# Patient Record
Sex: Female | Born: 1960 | Race: White | Hispanic: No | Smoking: Current every day smoker
Health system: Southern US, Community
[De-identification: ages and names within clinical notes are randomized; demographics above are authoritative.]

## PROBLEM LIST (undated history)

## (undated) DIAGNOSIS — M1990 Unspecified osteoarthritis, unspecified site: Secondary | ICD-10-CM

## (undated) HISTORY — PX: TUBAL LIGATION: SHX77

---

## 2006-12-11 ENCOUNTER — Ambulatory Visit (HOSPITAL_COMMUNITY): Admission: RE | Admit: 2006-12-11 | Discharge: 2006-12-11 | Payer: Self-pay | Admitting: Family Medicine

## 2006-12-20 ENCOUNTER — Ambulatory Visit (HOSPITAL_COMMUNITY): Admission: RE | Admit: 2006-12-20 | Discharge: 2006-12-20 | Payer: Self-pay | Admitting: Family Medicine

## 2007-01-03 ENCOUNTER — Ambulatory Visit (HOSPITAL_COMMUNITY): Admission: RE | Admit: 2007-01-03 | Discharge: 2007-01-03 | Payer: Self-pay | Admitting: Family Medicine

## 2008-05-30 ENCOUNTER — Ambulatory Visit (HOSPITAL_COMMUNITY): Admission: RE | Admit: 2008-05-30 | Discharge: 2008-05-30 | Payer: Self-pay | Admitting: Family Medicine

## 2010-12-20 ENCOUNTER — Other Ambulatory Visit (HOSPITAL_COMMUNITY): Payer: Self-pay | Admitting: Family Medicine

## 2010-12-20 DIAGNOSIS — Z139 Encounter for screening, unspecified: Secondary | ICD-10-CM

## 2010-12-28 ENCOUNTER — Ambulatory Visit (HOSPITAL_COMMUNITY): Payer: Self-pay

## 2011-01-04 ENCOUNTER — Ambulatory Visit (HOSPITAL_COMMUNITY)
Admission: RE | Admit: 2011-01-04 | Discharge: 2011-01-04 | Disposition: A | Discharge status: Home / Self Care | Payer: BC Managed Care – PPO | Source: Ambulatory Visit | Attending: Family Medicine | Admitting: Family Medicine

## 2011-01-04 DIAGNOSIS — Z1231 Encounter for screening mammogram for malignant neoplasm of breast: Secondary | ICD-10-CM | POA: Insufficient documentation

## 2011-01-04 DIAGNOSIS — Z139 Encounter for screening, unspecified: Secondary | ICD-10-CM

## 2012-02-15 ENCOUNTER — Other Ambulatory Visit (HOSPITAL_COMMUNITY): Payer: Self-pay | Admitting: Family Medicine

## 2012-02-15 DIAGNOSIS — Z139 Encounter for screening, unspecified: Secondary | ICD-10-CM

## 2012-02-20 ENCOUNTER — Ambulatory Visit (HOSPITAL_COMMUNITY): Payer: BC Managed Care – PPO

## 2012-02-23 ENCOUNTER — Ambulatory Visit (HOSPITAL_COMMUNITY): Payer: BC Managed Care – PPO

## 2012-03-01 ENCOUNTER — Ambulatory Visit (HOSPITAL_COMMUNITY): Payer: BC Managed Care – PPO

## 2012-03-13 ENCOUNTER — Ambulatory Visit (HOSPITAL_COMMUNITY): Payer: BC Managed Care – PPO

## 2012-03-15 ENCOUNTER — Ambulatory Visit (HOSPITAL_COMMUNITY): Payer: BC Managed Care – PPO

## 2012-08-07 ENCOUNTER — Ambulatory Visit (HOSPITAL_COMMUNITY)
Admission: RE | Admit: 2012-08-07 | Discharge: 2012-08-07 | Disposition: A | Discharge status: Home / Self Care | Payer: BC Managed Care – PPO | Source: Ambulatory Visit | Attending: Family Medicine | Admitting: Family Medicine

## 2012-08-07 DIAGNOSIS — Z1231 Encounter for screening mammogram for malignant neoplasm of breast: Secondary | ICD-10-CM | POA: Insufficient documentation

## 2012-08-07 DIAGNOSIS — Z139 Encounter for screening, unspecified: Secondary | ICD-10-CM

## 2013-07-02 ENCOUNTER — Telehealth: Payer: Self-pay

## 2013-07-02 NOTE — Telephone Encounter (Signed)
I called pt and she said she wants to check with her insurance first and call us back.

## 2013-07-31 NOTE — Telephone Encounter (Signed)
Letter mailed to pt.  

## 2013-10-10 ENCOUNTER — Encounter (HOSPITAL_COMMUNITY): Payer: Self-pay | Admitting: Emergency Medicine

## 2013-10-10 ENCOUNTER — Emergency Department (HOSPITAL_COMMUNITY)
Admission: EM | Admit: 2013-10-10 | Discharge: 2013-10-10 | Disposition: A | Discharge status: Home / Self Care | Payer: BC Managed Care – PPO | Attending: Emergency Medicine | Admitting: Emergency Medicine

## 2013-10-10 DIAGNOSIS — K029 Dental caries, unspecified: Secondary | ICD-10-CM | POA: Insufficient documentation

## 2013-10-10 DIAGNOSIS — F17200 Nicotine dependence, unspecified, uncomplicated: Secondary | ICD-10-CM | POA: Insufficient documentation

## 2013-10-10 DIAGNOSIS — M129 Arthropathy, unspecified: Secondary | ICD-10-CM | POA: Insufficient documentation

## 2013-10-10 DIAGNOSIS — Z791 Long term (current) use of non-steroidal anti-inflammatories (NSAID): Secondary | ICD-10-CM | POA: Insufficient documentation

## 2013-10-10 DIAGNOSIS — Z79899 Other long term (current) drug therapy: Secondary | ICD-10-CM | POA: Insufficient documentation

## 2013-10-10 DIAGNOSIS — K089 Disorder of teeth and supporting structures, unspecified: Secondary | ICD-10-CM | POA: Insufficient documentation

## 2013-10-10 HISTORY — DX: Unspecified osteoarthritis, unspecified site: M19.90

## 2013-10-10 MED ORDER — HYDROCODONE-ACETAMINOPHEN 5-325 MG PO TABS: 1.0000 | ORAL_TABLET | ORAL | Status: DC | PRN

## 2013-10-10 MED ORDER — PENICILLIN V POTASSIUM 500 MG PO TABS: 500.0000 mg | ORAL_TABLET | Freq: Three times a day (TID) | ORAL | Status: DC

## 2013-10-10 NOTE — ED Provider Notes (Signed)
Medical screening examination/treatment/procedure(s) were performed by non-physician practitioner and as supervising physician I was immediately available for consultation/collaboration.   EKG Interpretation None       Karlei Waldo, MD 10/10/13 2342 

## 2013-10-10 NOTE — Discharge Instructions (Signed)
Dental Care and Dentist Visits °Dental care supports good overall health. Regular dental visits can also help you avoid dental pain, bleeding, infection, and other more serious health problems in the future. It is important to keep the mouth healthy because diseases in the teeth, gums, and other oral tissues can spread to other areas of the body. Some problems, such as diabetes, heart disease, and pre-term labor have been associated with poor oral health.  °See your dentist every 6 months. If you experience emergency problems such as a toothache or broken tooth, go to the dentist right away. If you see your dentist regularly, you may catch problems early. It is easier to be treated for problems in the early stages.  °WHAT TO EXPECT AT A DENTIST VISIT  °Your dentist will look for many common oral health problems and recommend proper treatment. At your regular dental visit, you can expect: °· Gentle cleaning of the teeth and gums. This includes scraping and polishing. This helps to remove the sticky substance around the teeth and gums (plaque). Plaque forms in the mouth shortly after eating. Over time, plaque hardens on the teeth as tartar. If tartar is not removed regularly, it can cause problems. Cleaning also helps remove stains. °· Periodic X-rays. These pictures of the teeth and supporting bone will help your dentist assess the health of your teeth. °· Periodic fluoride treatments. Fluoride is a natural mineral shown to help strengthen teeth. Fluoride treatment involves applying a fluoride gel or varnish to the teeth. It is most commonly done in children. °· Examination of the mouth, tongue, jaws, teeth, and gums to look for any oral health problems, such as: °¨ Cavities (dental caries). This is decay on the tooth caused by plaque, sugar, and acid in the mouth. It is best to catch a cavity when it is small. °¨ Inflammation of the gums caused by plaque buildup (gingivitis). °¨ Problems with the mouth or malformed  or misaligned teeth. °¨ Oral cancer or other diseases of the soft tissues or jaws.  °KEEP YOUR TEETH AND GUMS HEALTHY °For healthy teeth and gums, follow these general guidelines as well as your dentist's specific advice: °· Have your teeth professionally cleaned at the dentist every 6 months. °· Brush twice daily with a fluoride toothpaste. °· Floss your teeth daily.  °· Ask your dentist if you need fluoride supplements, treatments, or fluoride toothpaste. °· Eat a healthy diet. Reduce foods and drinks with added sugar. °· Avoid smoking. °TREATMENT FOR ORAL HEALTH PROBLEMS °If you have oral health problems, treatment varies depending on the conditions present in your teeth and gums. °· Your caregiver will most likely recommend good oral hygiene at each visit. °· For cavities, gingivitis, or other oral health disease, your caregiver will perform a procedure to treat the problem. This is typically done at a separate appointment. Sometimes your caregiver will refer you to another dental specialist for specific tooth problems or for surgery. °SEEK IMMEDIATE DENTAL CARE IF: °· You have pain, bleeding, or soreness in the gum, tooth, jaw, or mouth area. °· A permanent tooth becomes loose or separated from the gum socket. °· You experience a blow or injury to the mouth or jaw area. °Document Released: 11/10/2010 Document Revised: 05/23/2011 Document Reviewed: 11/10/2010 °ExitCare® Patient Information ©2015 [employer]. This information is not intended to replace advice given to you by your health care provider. Make sure you discuss any questions you have with your health care provider. ° °Dental Caries °Dental caries is tooth decay. This   decay can cause a hole in teeth (cavity) that can get bigger and deeper over time. °HOME CARE °· Brush and floss your teeth. Do this at least two times a day. °· Use a fluoride toothpaste. °· Use a mouth rinse if told by your dentist or doctor. °· Eat less sugary and starchy foods.  Drink less sugary drinks. °· Avoid snacking often on sugary and starchy foods. Avoid sipping often on sugary drinks. °· Keep regular checkups and cleanings with your dentist. °· Use fluoride supplements if told by your dentist or doctor. °· Allow fluoride to be applied to teeth if told by your dentist or doctor. °Document Released: 12/08/2007 Document Revised: 07/15/2013 Document Reviewed: 03/02/2012 °ExitCare® Patient Information ©2015 [employer]. This information is not intended to replace advice given to you by your health care provider. Make sure you discuss any questions you have with your health care provider. ° °Emergency Department Resource Guide °1) Find a Doctor and Pay Out of Pocket °Although you won't have to find out who is covered by your insurance plan, it is a good idea to ask around and get recommendations. You will then need to call the office and see if the doctor you have chosen will accept you as a new patient and what types of options they offer for patients who are self-pay. Some doctors offer discounts or will set up payment plans for their patients who do not have insurance, but you will need to ask so you aren't surprised when you get to your appointment. ° °2) Contact Your Local Health Department °Not all health departments have doctors that can see patients for sick visits, but many do, so it is worth a call to see if yours does. If you don't know where your local health department is, you can check in your phone book. The CDC also has a tool to help you locate your state's health department, and many state websites also have listings of all of their local health departments. ° °3) Find a Walk-in Clinic °If your illness is not likely to be very severe or complicated, you may want to try a walk in clinic. These are popping up all over the country in pharmacies, drugstores, and shopping centers. They're usually staffed by nurse practitioners or physician assistants that have been trained  to treat common illnesses and complaints. They're usually fairly quick and inexpensive. However, if you have serious medical issues or chronic medical problems, these are probably not your best option. ° °No Primary Care Doctor: °- Call [facility] at  832-8000 - they can help you locate a primary care doctor that  accepts your insurance, provides certain services, etc. °- Physician Referral Service- 1-800-533-3463 ° ° ° °Dental Care: °Organization         Address  Phone  Notes  °[facility] [address], [place] [phone] Accepts children up to age 21 who are enrolled in Medicaid or [place] Health Choice; pregnant women with a Medicaid card; and children who have applied for Medicaid or [place] Health Choice, but were declined, whose parents can pay a reduced fee at time of service.  °[facility] [place]  [address], [place] [phone] Accepts children up to age 21 who are enrolled in Medicaid or [place] Health Choice; pregnant women with a Medicaid card; and children who have applied for Medicaid or [place] Health Choice, but were declined, whose parents   can pay a reduced fee at time of service.  °[facility] ° [address], [place] [phone] Patients are seen by appointment only. Walk-ins are not accepted. [facility] will see patients 18 years of age and older. °Monday - Tuesday (8am-5pm) °Most Wednesdays (8:30-5pm) °$30 per visit, cash only  °[facility] ° [address], [place] [phone] Patients are seen by appointment only. Walk-ins are not accepted. [facility] will see patients 18 years of age and older. °One Wednesday Evening (Monthly: Volunteer Based).  $30 per visit, cash only  °[facility]  [phone] for adults; Children under age 4, call Graduate Pediatric Dentistry at [phone].  Children aged 4-14, please call [phone] to request a pediatric application. ° Dental services are provided in all areas of dental care including fillings, crowns and bridges, complete and partial dentures, implants, gum treatment, root canals, and extractions. Preventive care is also provided. Treatment is provided to both adults and children. °Patients are selected via a lottery and there is often a waiting list. °  °[facility] [address], °[place] ° [phone] [email or web address].[email or web address] °  °[facility] [address], [place], [place] [phone], Ext. [phone] Second and Fourth Thursday of each month, opens at 6:30 AM; Clinic ends at 9 AM.  Patients are seen on a first-come first-served basis, and a limited number are seen during each clinic.  ° °[facility] ° [address], [place], [place] [phone]   Eligibility Requirements °You must have lived in [place], [place], or [place] counties for at least the last three months. °  You cannot be eligible for state or federal sponsored healthcare insurance, including Veterans Administration, Medicaid, or Medicare. °  You generally cannot be eligible for healthcare insurance through your employer.  °  How to apply: °Eligibility screenings are held every Tuesday and Wednesday afternoon from 1:00 pm until 4:00 pm. You do not need an appointment for the interview!  °[facility] [address], [place], [place] [phone]   °[facility]  [phone]   °[facility]  [phone]   ° [facility]  [phone]   ° °

## 2013-10-10 NOTE — ED Notes (Signed)
Pain rt mandibular molar for  2 days

## 2013-10-10 NOTE — ED Provider Notes (Signed)
CSN: [id number]     Arrival date & time 10/10/13  1540 History   First MD Initiated Contact with Patient 10/10/13 1600     Chief Complaint  Patient presents with  . Dental Pain     (Consider location/radiation/quality/duration/timing/severity/associated sxs/prior Treatment) Patient is a 53 y.o. female presenting with tooth pain. The history is provided by the patient. No language interpreter was used.  Dental Pain Location:  Lower Lower teeth location:  32/RL 3rd molar Severity:  Moderate Duration:  2 days Timing:  Constant Associated symptoms: no facial swelling and no fever   Associated symptoms comment:  New pain at eroding tooth for the past 2 days. No fever or facial swelling.   Past Medical History  Diagnosis Date  . Arthritis    Past Surgical History  Procedure Laterality Date  . Tubal ligation     History reviewed. No pertinent family history. History  Substance Use Topics  . Smoking status: Current Every Day Smoker  . Smokeless tobacco: Not on file  . Alcohol Use: No   OB History   Grav Para Term Preterm Abortions TAB SAB Ect Mult Living                 Review of Systems  Constitutional: Negative for fever.  HENT: Positive for dental problem. Negative for facial swelling and trouble swallowing.   Gastrointestinal: Negative for nausea.      Allergies  Augmentin  Home Medications   Prior to Admission medications   Medication Sig Start Date End Date Taking? Authorizing Provider  acetaminophen (TYLENOL) 500 MG tablet Take 1,000 mg by mouth every 6 (six) hours as needed for mild pain or moderate pain.   Yes Historical Provider, MD  B-Complex TABS Take 1 tablet by mouth daily.   Yes Historical Provider, MD  cholecalciferol (VITAMIN D) 1000 UNITS tablet Take 1,000 Units by mouth at bedtime.   Yes Historical Provider, MD  meloxicam (MOBIC) 15 MG tablet Take 15 mg by mouth every evening.   Yes Historical Provider, MD   BP 133/89  Pulse 69  Temp(Src) 98.4  F (36.9 C) (Oral)  Resp 18  SpO2 97% Physical Exam  Constitutional: She appears well-developed and well-nourished. No distress.  HENT:  Significant erosion to #32. Partially edentulous. No facial swelling. No submental adenopathy.  Pulmonary/Chest: Effort normal.  Lymphadenopathy:    She has no cervical adenopathy.    ED Course  Procedures (including critical care time) Labs Review Labs Reviewed - No data to display  Imaging Review No results found.   EKG Interpretation None      MDM   Final diagnoses:  None    1. Dental caries  Severe decay of right lower wisdom tooth. Will cover with abx, pain medications, dental referral.    Arnoldo HookerShari A Khushi Zupko, PA-C 10/10/13 1652

## 2013-10-10 NOTE — ED Notes (Signed)
Pt alert & oriented x4, stable gait. Patient given discharge instructions, paperwork & prescription(s). Patient  instructed to stop at the registration desk to finish any additional paperwork. Patient verbalized understanding. Pt left department w/ no further questions. 

## 2013-10-21 ENCOUNTER — Other Ambulatory Visit (HOSPITAL_COMMUNITY): Payer: Self-pay | Admitting: Family Medicine

## 2013-10-21 DIAGNOSIS — Z139 Encounter for screening, unspecified: Secondary | ICD-10-CM

## 2013-10-30 ENCOUNTER — Ambulatory Visit (HOSPITAL_COMMUNITY)
Admission: RE | Admit: 2013-10-30 | Discharge: 2013-10-30 | Disposition: A | Discharge status: Home / Self Care | Payer: BC Managed Care – PPO | Source: Ambulatory Visit | Attending: Family Medicine | Admitting: Family Medicine

## 2013-10-30 DIAGNOSIS — R928 Other abnormal and inconclusive findings on diagnostic imaging of breast: Secondary | ICD-10-CM | POA: Diagnosis not present

## 2013-10-30 DIAGNOSIS — Z1231 Encounter for screening mammogram for malignant neoplasm of breast: Secondary | ICD-10-CM | POA: Insufficient documentation

## 2013-10-30 DIAGNOSIS — Z139 Encounter for screening, unspecified: Secondary | ICD-10-CM

## 2013-11-01 ENCOUNTER — Other Ambulatory Visit: Payer: Self-pay | Admitting: Family Medicine

## 2013-11-01 DIAGNOSIS — R928 Other abnormal and inconclusive findings on diagnostic imaging of breast: Secondary | ICD-10-CM

## 2013-11-04 ENCOUNTER — Other Ambulatory Visit: Payer: Self-pay | Admitting: Family Medicine

## 2013-11-04 DIAGNOSIS — R928 Other abnormal and inconclusive findings on diagnostic imaging of breast: Secondary | ICD-10-CM

## 2013-11-07 ENCOUNTER — Other Ambulatory Visit: Payer: BC Managed Care – PPO

## 2013-11-13 ENCOUNTER — Ambulatory Visit
Admission: RE | Admit: 2013-11-13 | Discharge: 2013-11-13 | Disposition: A | Discharge status: Home / Self Care | Payer: BC Managed Care – PPO | Source: Ambulatory Visit | Attending: Family Medicine | Admitting: Family Medicine

## 2013-11-13 ENCOUNTER — Encounter (INDEPENDENT_AMBULATORY_CARE_PROVIDER_SITE_OTHER): Payer: Self-pay

## 2013-11-13 DIAGNOSIS — R928 Other abnormal and inconclusive findings on diagnostic imaging of breast: Secondary | ICD-10-CM

## 2014-04-02 ENCOUNTER — Telehealth: Payer: Self-pay

## 2014-04-11 NOTE — Telephone Encounter (Signed)
Pt wants to wait til April 2016 for colonoscopy. Letter sent to PCP.

## 2014-04-24 IMAGING — MG MM DIGITAL SCREENING
4 series · 4 of 4 positions shown · non-contrast
Comparison: Previous exams.

CLINICAL DATA: Screening.

DIGITAL BILATERAL SCREENING MAMMOGRAM WITH CAD

[L CC]
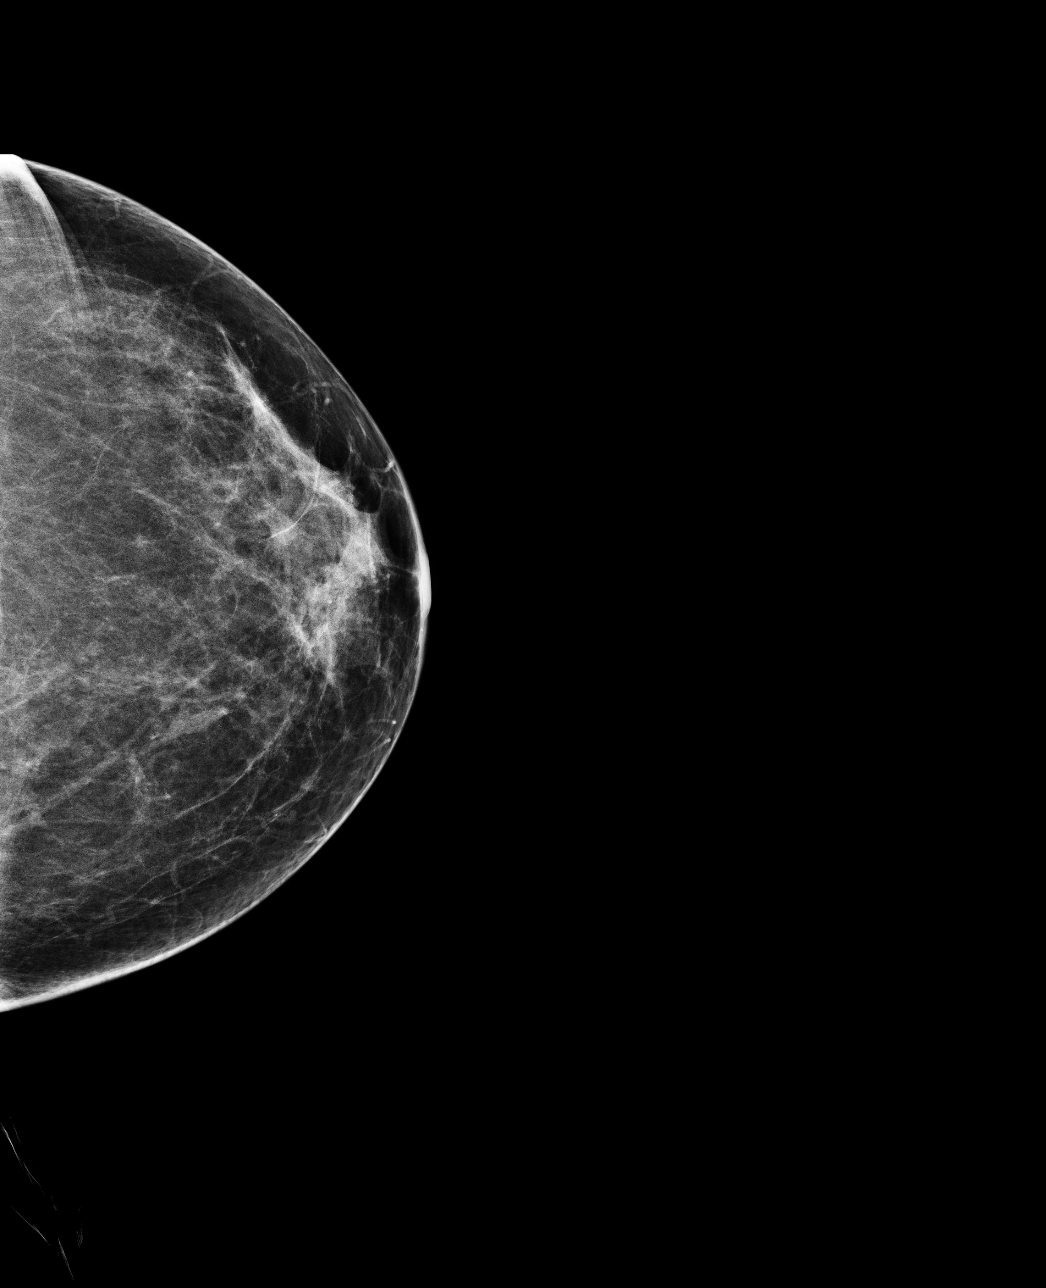

[L MLO]
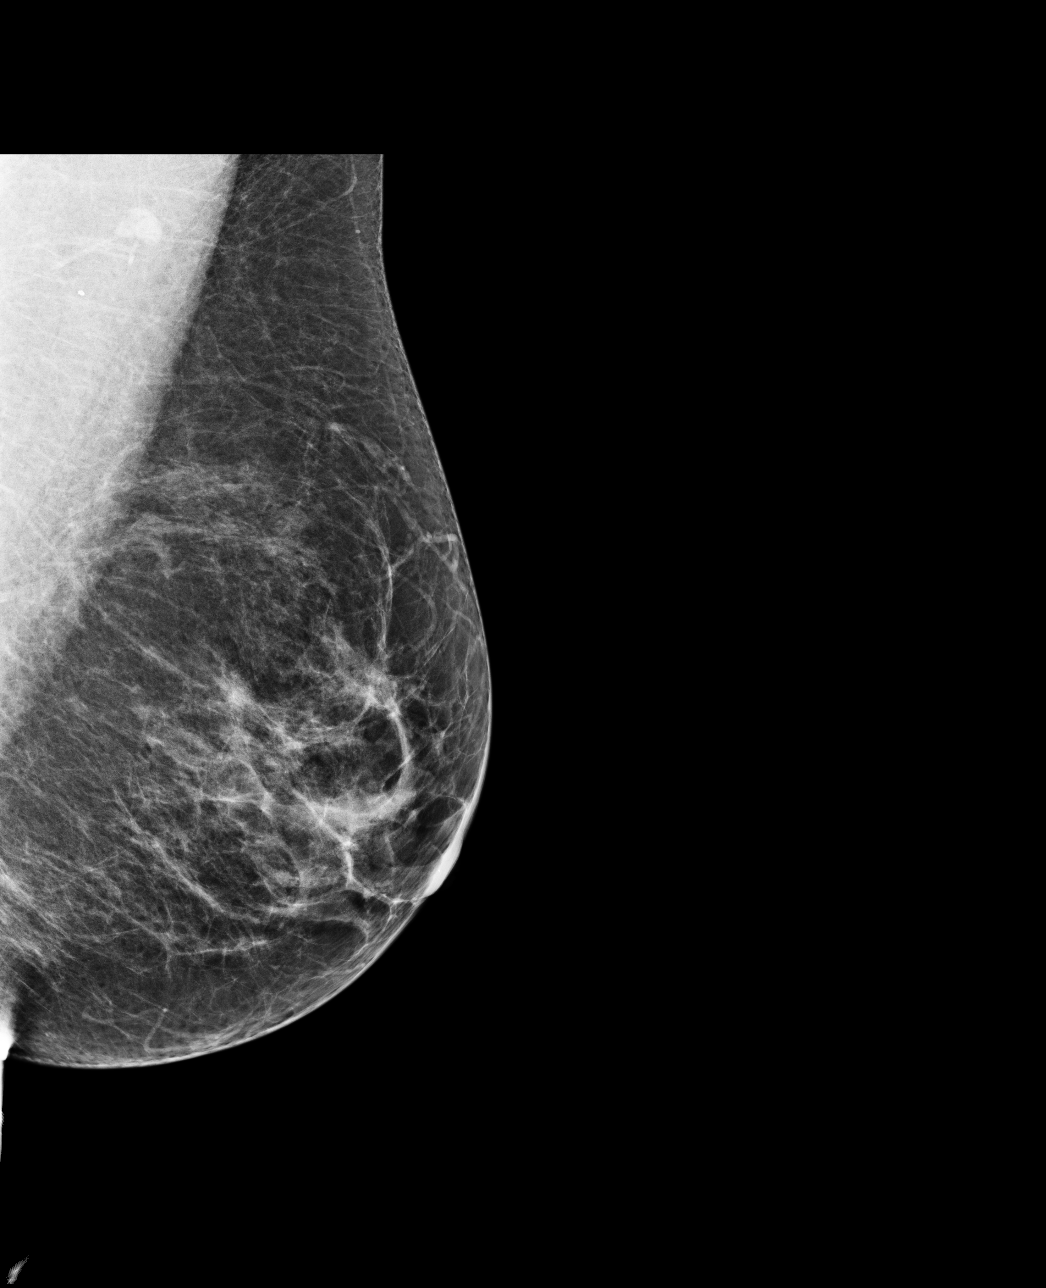

[R CC]
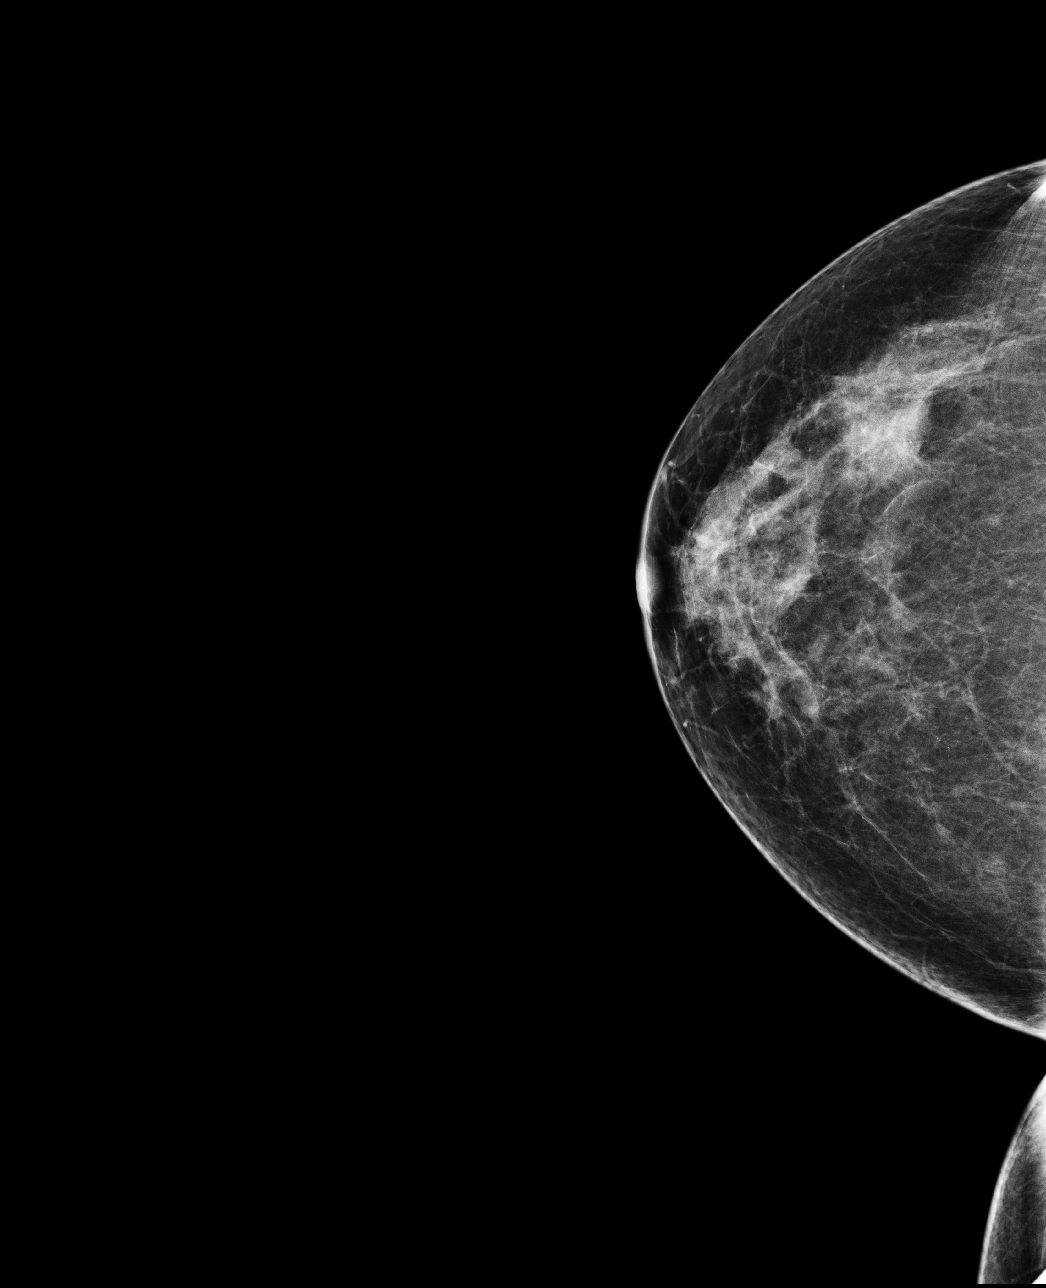

[R MLO]
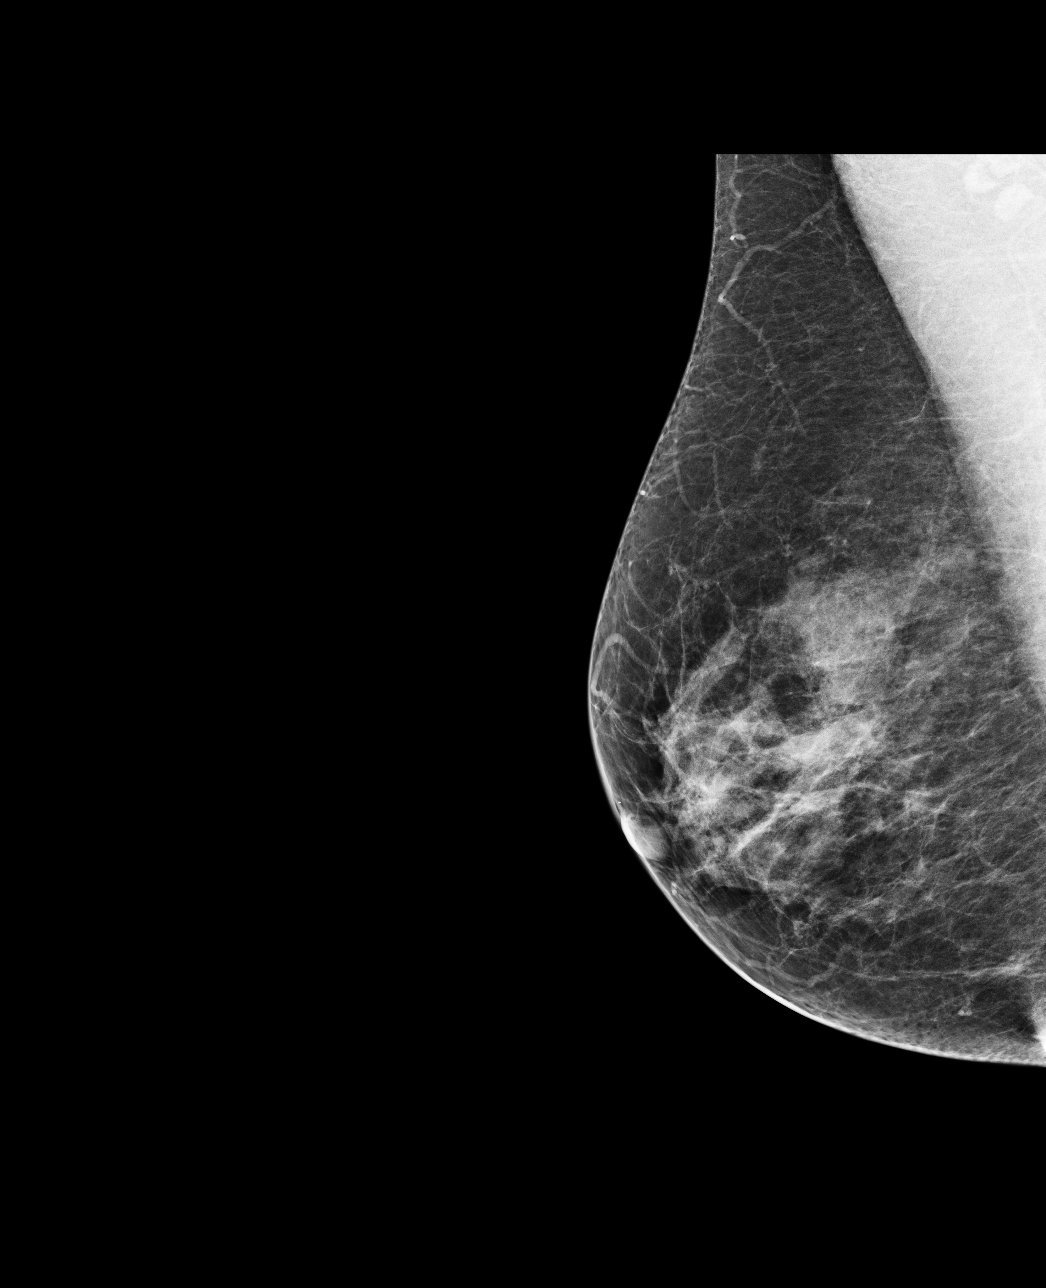

[4 of 4 positions shown; findings below may reference images not displayed]

FINDINGS: ACR Breast Density Category 2: There is a scattered fibroglandular
pattern.

There are no findings suspicious for malignancy.

Images were processed with CAD.
IMPRESSION: No mammographic evidence of malignancy.

A result letter of this screening mammogram will be mailed directly
to the patient.

RECOMMENDATION:
Screening mammogram in one year. (Code:[id number])

BI-RADS CATEGORY 1:  Negative.

## 2015-02-27 ENCOUNTER — Other Ambulatory Visit (HOSPITAL_COMMUNITY): Payer: Self-pay | Admitting: Physician Assistant

## 2015-02-27 DIAGNOSIS — Z1231 Encounter for screening mammogram for malignant neoplasm of breast: Secondary | ICD-10-CM

## 2015-03-11 ENCOUNTER — Ambulatory Visit (HOSPITAL_COMMUNITY)
Admission: RE | Admit: 2015-03-11 | Discharge: 2015-03-11 | Disposition: A | Discharge status: Home / Self Care | Payer: BLUE CROSS/BLUE SHIELD | Source: Ambulatory Visit | Attending: Physician Assistant | Admitting: Physician Assistant

## 2015-03-11 DIAGNOSIS — Z1231 Encounter for screening mammogram for malignant neoplasm of breast: Secondary | ICD-10-CM | POA: Diagnosis present

## 2015-05-15 ENCOUNTER — Other Ambulatory Visit: Payer: Self-pay | Admitting: *Deleted

## 2016-04-29 ENCOUNTER — Other Ambulatory Visit (HOSPITAL_COMMUNITY): Payer: Self-pay | Admitting: Internal Medicine

## 2016-04-29 DIAGNOSIS — Z1231 Encounter for screening mammogram for malignant neoplasm of breast: Secondary | ICD-10-CM

## 2016-05-25 ENCOUNTER — Ambulatory Visit (HOSPITAL_COMMUNITY): Payer: BLUE CROSS/BLUE SHIELD

## 2016-06-15 ENCOUNTER — Ambulatory Visit (HOSPITAL_COMMUNITY)
Admission: RE | Admit: 2016-06-15 | Discharge: 2016-06-15 | Disposition: A | Discharge status: Home / Self Care | Payer: BLUE CROSS/BLUE SHIELD | Source: Ambulatory Visit | Attending: Internal Medicine | Admitting: Internal Medicine

## 2016-06-15 DIAGNOSIS — Z1231 Encounter for screening mammogram for malignant neoplasm of breast: Secondary | ICD-10-CM | POA: Diagnosis present

## 2016-08-24 ENCOUNTER — Ambulatory Visit (HOSPITAL_COMMUNITY)
Admission: RE | Admit: 2016-08-24 | Discharge: 2016-08-24 | Disposition: A | Discharge status: Home / Self Care | Payer: BLUE CROSS/BLUE SHIELD | Source: Ambulatory Visit | Attending: Family Medicine | Admitting: Family Medicine

## 2016-08-24 ENCOUNTER — Other Ambulatory Visit (HOSPITAL_COMMUNITY): Payer: Self-pay | Admitting: Family Medicine

## 2016-08-24 DIAGNOSIS — M79671 Pain in right foot: Secondary | ICD-10-CM | POA: Insufficient documentation

## 2016-08-24 DIAGNOSIS — M25572 Pain in left ankle and joints of left foot: Secondary | ICD-10-CM | POA: Diagnosis not present

## 2016-08-24 DIAGNOSIS — R52 Pain, unspecified: Secondary | ICD-10-CM

## 2016-08-24 DIAGNOSIS — M19072 Primary osteoarthritis, left ankle and foot: Secondary | ICD-10-CM | POA: Diagnosis not present

## 2016-08-24 DIAGNOSIS — M25562 Pain in left knee: Secondary | ICD-10-CM | POA: Diagnosis present

## 2016-08-24 DIAGNOSIS — M25561 Pain in right knee: Secondary | ICD-10-CM | POA: Insufficient documentation

## 2016-08-24 DIAGNOSIS — M25571 Pain in right ankle and joints of right foot: Secondary | ICD-10-CM | POA: Insufficient documentation

## 2016-08-24 DIAGNOSIS — G8929 Other chronic pain: Secondary | ICD-10-CM | POA: Insufficient documentation

## 2016-08-24 DIAGNOSIS — M79672 Pain in left foot: Secondary | ICD-10-CM | POA: Diagnosis present

## 2016-08-24 DIAGNOSIS — M1711 Unilateral primary osteoarthritis, right knee: Secondary | ICD-10-CM | POA: Insufficient documentation

## 2016-11-30 ENCOUNTER — Telehealth: Payer: Self-pay

## 2016-11-30 NOTE — Telephone Encounter (Signed)
Pt received a triage letter from DS and she wants to schedule her colonoscopy for sometime in October. I told her the triage nurse does these in the afternoon from 130-430pm. She said that she works at [employer] and can't get a good signal if we call her while she's at work. She said to [facility] and she would call back. [phone]

## 2016-12-06 NOTE — Telephone Encounter (Signed)
Pt is busy now and will call back this afternoon to be triaged.

## 2016-12-08 ENCOUNTER — Telehealth: Payer: Self-pay

## 2016-12-08 NOTE — Telephone Encounter (Signed)
See separate triage.  

## 2016-12-13 NOTE — Telephone Encounter (Signed)
Needs office visit. On Ativan and chronic narcotics it appears?

## 2016-12-13 NOTE — Telephone Encounter (Signed)
Gastroenterology Pre-Procedure Review  Request Date: 12/08/2016 Requesting Physician: Debbe Mounts, MD  PATIENT REVIEW QUESTIONS: The patient responded to the following health history questions as indicated:    1. Diabetes Melitis: no 2. Joint replacements in the past 12 months: no 3. Major health problems in the past 3 months: no 4. Has an artificial valve or MVP: no 5. Has a defibrillator: no 6. Has been advised in past to take antibiotics in advance of a procedure like teeth cleaning: no 7. Family history of colon cancer: no  8. Alcohol Use: no 9. History of sleep apnea: no  10. History of coronary artery or other vascular stents placed within the last 12 months: no 11. History of any prior anesthesia complications: no    MEDICATIONS & ALLERGIES:    Patient reports the following regarding taking any blood thinners:   Plavix? no Aspirin? no Coumadin? no Brilinta? no Xarelto? no Eliquis? no Pradaxa? no Savaysa? no Effient? no  Patient confirms/reports the following medications:  Current Outpatient Prescriptions  Medication Sig Dispense Refill  . acetaminophen (TYLENOL) 500 MG tablet Take 1,000 mg by mouth every 6 (six) hours as needed for mild pain or moderate pain.    Marland Kitchen B-Complex TABS Take 1 tablet by mouth daily.    . cholecalciferol (VITAMIN D) 1000 UNITS tablet Take 5,000 Units by mouth at bedtime.     Marland Kitchen LORazepam (ATIVAN) 0.5 MG tablet Take 0.5 mg by mouth 2 (two) times daily.    . meloxicam (MOBIC) 15 MG tablet Take 15 mg by mouth every evening.    Marland Kitchen oxyCODONE-acetaminophen (PERCOCET) 10-325 MG tablet Take 1 tablet by mouth every 4 (four) hours as needed for pain. Takes one tablet 4 times a day  ( arthritis)     No current facility-administered medications for this visit.     Patient confirms/reports the following allergies:  Allergies  Allergen Reactions  . Augmentin [Amoxicillin-Pot Clavulanate] Itching and Rash    No orders of the defined types were  placed in this encounter.   AUTHORIZATION INFORMATION Primary Insurance:   ID #:  Group #:  Pre-Cert / Auth required:  Pre-Cert / Auth #:   Secondary Insurance:   ID #:  Group #:  Pre-Cert / Auth required:  Pre-Cert / Auth #:   SCHEDULE INFORMATION: Procedure has been scheduled as follows:  Date:              Time:   Location:   This Gastroenterology Pre-Precedure Review Form is being routed to the following provider(s): Jonette Eva, MD

## 2016-12-14 NOTE — Telephone Encounter (Signed)
Pt has been scheduled OV with Wynne Dust, NP on 02/01/2017 at 10:30.

## 2017-02-01 ENCOUNTER — Ambulatory Visit: Payer: BLUE CROSS/BLUE SHIELD | Admitting: Nurse Practitioner

## 2017-03-28 ENCOUNTER — Ambulatory Visit: Payer: BLUE CROSS/BLUE SHIELD | Admitting: Nurse Practitioner

## 2017-10-25 ENCOUNTER — Other Ambulatory Visit (HOSPITAL_COMMUNITY): Payer: Self-pay | Admitting: Family Medicine

## 2017-10-25 DIAGNOSIS — Z1231 Encounter for screening mammogram for malignant neoplasm of breast: Secondary | ICD-10-CM

## 2017-11-01 ENCOUNTER — Ambulatory Visit (HOSPITAL_COMMUNITY): Payer: BLUE CROSS/BLUE SHIELD

## 2017-12-06 ENCOUNTER — Other Ambulatory Visit: Payer: Self-pay | Admitting: Adult Health

## 2018-01-12 ENCOUNTER — Other Ambulatory Visit: Payer: Self-pay | Admitting: Adult Health

## 2018-03-01 ENCOUNTER — Other Ambulatory Visit: Payer: Self-pay | Admitting: Adult Health

## 2020-04-22 ENCOUNTER — Ambulatory Visit (HOSPITAL_COMMUNITY)
Admission: RE | Admit: 2020-04-22 | Discharge: 2020-04-22 | Disposition: A | Discharge status: Home / Self Care | Payer: BC Managed Care – PPO | Source: Ambulatory Visit | Attending: Family Medicine | Admitting: Family Medicine

## 2020-04-22 ENCOUNTER — Other Ambulatory Visit (HOSPITAL_COMMUNITY): Payer: Self-pay | Admitting: Family Medicine

## 2020-04-22 ENCOUNTER — Other Ambulatory Visit: Payer: Self-pay

## 2020-04-22 DIAGNOSIS — W19XXXA Unspecified fall, initial encounter: Secondary | ICD-10-CM | POA: Insufficient documentation

## 2020-04-22 DIAGNOSIS — Y929 Unspecified place or not applicable: Secondary | ICD-10-CM | POA: Insufficient documentation

## 2020-04-22 DIAGNOSIS — Y939 Activity, unspecified: Secondary | ICD-10-CM | POA: Diagnosis not present

## 2020-04-22 DIAGNOSIS — Z1231 Encounter for screening mammogram for malignant neoplasm of breast: Secondary | ICD-10-CM

## 2020-04-22 DIAGNOSIS — M79601 Pain in right arm: Secondary | ICD-10-CM | POA: Diagnosis present

## 2020-05-06 ENCOUNTER — Inpatient Hospital Stay (HOSPITAL_COMMUNITY): Admission: RE | Admit: 2020-05-06 | Payer: BC Managed Care – PPO | Source: Ambulatory Visit

## 2020-05-13 ENCOUNTER — Ambulatory Visit (HOSPITAL_COMMUNITY)
Admission: RE | Admit: 2020-05-13 | Discharge: 2020-05-13 | Disposition: A | Discharge status: Home / Self Care | Payer: BC Managed Care – PPO | Source: Ambulatory Visit | Attending: Family Medicine | Admitting: Family Medicine

## 2020-05-13 ENCOUNTER — Other Ambulatory Visit: Payer: Self-pay

## 2020-05-13 DIAGNOSIS — Z1231 Encounter for screening mammogram for malignant neoplasm of breast: Secondary | ICD-10-CM | POA: Insufficient documentation

## 2021-05-31 ENCOUNTER — Other Ambulatory Visit: Payer: BC Managed Care – PPO | Admitting: Adult Health

## 2021-06-29 ENCOUNTER — Other Ambulatory Visit: Payer: BC Managed Care – PPO | Admitting: Obstetrics & Gynecology

## 2021-07-21 ENCOUNTER — Other Ambulatory Visit: Payer: BC Managed Care – PPO | Admitting: Obstetrics & Gynecology

## 2021-09-06 ENCOUNTER — Other Ambulatory Visit: Payer: BC Managed Care – PPO | Admitting: Obstetrics & Gynecology

## 2021-09-24 ENCOUNTER — Other Ambulatory Visit: Payer: BC Managed Care – PPO | Admitting: Obstetrics & Gynecology

## 2022-09-27 ENCOUNTER — Other Ambulatory Visit (HOSPITAL_COMMUNITY): Payer: Self-pay | Admitting: Adult Health

## 2022-09-27 DIAGNOSIS — M79601 Pain in right arm: Secondary | ICD-10-CM
